# Patient Record
Sex: Female | Born: 1945 | Race: White | Hispanic: No | Marital: Married
Health system: Southern US, Community
[De-identification: ages and names within clinical notes are randomized; demographics above are authoritative.]

---

## 2018-05-07 ENCOUNTER — Other Ambulatory Visit: Payer: Self-pay | Admitting: Family Medicine

## 2018-05-07 DIAGNOSIS — Z1231 Encounter for screening mammogram for malignant neoplasm of breast: Secondary | ICD-10-CM

## 2018-05-25 ENCOUNTER — Ambulatory Visit: Payer: Self-pay

## 2018-09-17 ENCOUNTER — Other Ambulatory Visit: Payer: Self-pay | Admitting: Family Medicine

## 2018-09-17 DIAGNOSIS — E2839 Other primary ovarian failure: Secondary | ICD-10-CM

## 2018-11-18 ENCOUNTER — Other Ambulatory Visit: Payer: Self-pay

## 2019-01-07 ENCOUNTER — Ambulatory Visit
Admission: RE | Admit: 2019-01-07 | Discharge: 2019-01-07 | Disposition: A | Discharge status: Home / Self Care | Payer: Managed Care, Other (non HMO) | Source: Ambulatory Visit | Attending: Family Medicine | Admitting: Family Medicine

## 2019-01-07 DIAGNOSIS — E2839 Other primary ovarian failure: Secondary | ICD-10-CM

## 2019-05-03 ENCOUNTER — Telehealth: Payer: Self-pay

## 2019-05-03 NOTE — Telephone Encounter (Signed)
Call put through to the DOD about 4:15pm from [facility] with Dr. Hal Hope office [phone]... reporting possible DVT of the pts left leg and pt needs ASAP appt.. msg given to me in triage and I spoke with Lorelle Gibbs about options for the pt since after 4:30pm and I called Asher Muir back for more information about the pt and she reported she found someone else to see the pt this evening. I apologized and advised her we were working on the best option for the pt but she says it was okay she made other calls not knowing for sure on her end that they would get a call back this late in the afternoon. I asked her to please feel free to call us for any of their pt needs in the future. She agreed and was appreciative for the call.

## 2019-05-04 ENCOUNTER — Other Ambulatory Visit: Payer: Self-pay | Admitting: Family Medicine

## 2019-05-04 ENCOUNTER — Other Ambulatory Visit: Payer: Managed Care, Other (non HMO)

## 2019-05-04 DIAGNOSIS — I82492 Acute embolism and thrombosis of other specified deep vein of left lower extremity: Secondary | ICD-10-CM

## 2019-05-04 DIAGNOSIS — I824Y2 Acute embolism and thrombosis of unspecified deep veins of left proximal lower extremity: Secondary | ICD-10-CM

## 2020-01-23 ENCOUNTER — Ambulatory Visit: Payer: Commercial Indemnity

## 2020-01-24 ENCOUNTER — Ambulatory Visit: Payer: Commercial Indemnity | Attending: Internal Medicine

## 2020-01-24 DIAGNOSIS — Z23 Encounter for immunization: Secondary | ICD-10-CM | POA: Insufficient documentation

## 2020-01-24 NOTE — Progress Notes (Signed)
   Covid-19 Vaccination Clinic  Name:  KHRYSTAL JEANMARIE    MRN: [id number] DOB: [birth year]  01/24/2020  Ms. Hochberg was observed post Covid-19 immunization for 15 minutes without incidence. She was provided with Vaccine Information Sheet and instruction to access the V-Safe system.   Ms. Wenz was instructed to call 911 with any severe reactions post vaccine: Marland Kitchen Difficulty breathing  . Swelling of your face and throat  . A fast heartbeat  . A bad rash all over your body  . Dizziness and weakness    Immunizations Administered    Name Date Dose VIS Date Route   Pfizer COVID-19 Vaccine 01/24/2020  1:16 PM 0.3 mL 11/25/2019 Intramuscular   Manufacturer: [employer], [employer]   Lot: [id number]   NDC: [address]-6

## 2020-02-18 ENCOUNTER — Ambulatory Visit: Payer: Commercial Indemnity | Attending: Internal Medicine

## 2020-02-18 DIAGNOSIS — Z23 Encounter for immunization: Secondary | ICD-10-CM | POA: Insufficient documentation

## 2020-02-18 NOTE — Progress Notes (Signed)
   Covid-19 Vaccination Clinic  Name:  Elizabeth Powell    MRN: [id number] DOB: Apr 19, 1946  02/18/2020  Elizabeth Powell was observed post Covid-19 immunization for 15 minutes without incident. She was provided with Vaccine Information Sheet and instruction to access the V-Safe system.   Elizabeth Powell was instructed to call 911 with any severe reactions post vaccine: Marland Kitchen Difficulty breathing  . Swelling of face and throat  . A fast heartbeat  . A bad rash all over body  . Dizziness and weakness   Immunizations Administered    Name Date Dose VIS Date Route   Pfizer COVID-19 Vaccine 02/18/2020  8:10 AM 0.3 mL 11/25/2019 Intramuscular   Manufacturer: [employer], [employer]   Lot: [id number]   NDC: [address]-2
